# Patient Record
Sex: Female | Born: 2000 | Race: Black or African American | Hispanic: No | Marital: Single | Smoking: Never smoker
Health system: Southern US, Community
[De-identification: ages and names within clinical notes are randomized; demographics above are authoritative.]

## PROBLEM LIST (undated history)

## (undated) DIAGNOSIS — F419 Anxiety disorder, unspecified: Secondary | ICD-10-CM

## (undated) DIAGNOSIS — F32A Depression, unspecified: Secondary | ICD-10-CM

## (undated) HISTORY — PX: NO PAST SURGERIES: SHX2092

## (undated) HISTORY — DX: Depression, unspecified: F32.A

## (undated) HISTORY — DX: Anxiety disorder, unspecified: F41.9

---
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
## 2020-01-14 ENCOUNTER — Other Ambulatory Visit: Payer: Self-pay

## 2020-01-14 ENCOUNTER — Encounter: Payer: Self-pay | Admitting: Primary Care

## 2020-01-14 ENCOUNTER — Ambulatory Visit (INDEPENDENT_AMBULATORY_CARE_PROVIDER_SITE_OTHER): Payer: Managed Care, Other (non HMO) | Admitting: Primary Care

## 2020-01-14 DIAGNOSIS — F331 Major depressive disorder, recurrent, moderate: Secondary | ICD-10-CM | POA: Diagnosis not present

## 2020-01-14 DIAGNOSIS — F411 Generalized anxiety disorder: Secondary | ICD-10-CM

## 2020-01-14 MED ORDER — SERTRALINE HCL 25 MG PO TABS: 25.0000 mg | ORAL_TABLET | Freq: Every day | ORAL | 3 refills | Status: DC

## 2020-01-14 NOTE — Assessment & Plan Note (Signed)
Significant improvement on sertraline (Zoloft), she appears much better today. Denies SI/HI.  Refills provided. Continue sertraline 25 mg.

## 2020-01-14 NOTE — Progress Notes (Signed)
Subjective:    Patient ID: Brandy Bowen, female    DOB: 2001-04-22, 19 y.o.   MRN: [id number]  HPI  This visit occurred during the SARS-CoV-2 public health emergency.  Safety protocols were in place, including screening questions prior to the visit, additional usage of staff PPE, and extensive cleaning of exam room while observing appropriate contact time as indicated for disinfecting solutions.    Brandy Bowen is a 19 year old female with a history of depression, anxiety, frequent headaches who presents today for follow up .  She was last evaluated on 12/01/19 with symptoms of depression and anxiety. Symptoms included feeling anxious, tearfulness, worry. GAD 7 score of 16. She had been off of her Lexapro for quite some time as she didn't feel it helped with anxiety. Previously managed on fluoxetine which caused suicidal thoughts. During her last visit we initiated Zoloft 25 mg.  Since her last visit she's doing much better. She can drive in the car without worrying, feeling more motivated to do things, very little anxiety feelings, one anxiety attack since last visit. She denies headaches, SI/HI.   Review of Systems  Gastrointestinal: Negative for nausea.  Neurological: Negative for headaches.  Psychiatric/Behavioral: Negative for suicidal ideas.       See HPI       No past medical history on file.   Social History   Socioeconomic History  . Marital status: Single    Spouse name: Not on file  . Number of children: Not on file  . Years of education: Not on file  . Highest education level: Not on file  Occupational History  . Not on file  Tobacco Use  . Smoking status: Never Smoker  . Smokeless tobacco: Never Used  Substance and Sexual Activity  . Alcohol use: No    Alcohol/week: 0.0 standard drinks  . Drug use: No  . Sexual activity: Not on file  Other Topics Concern  . Not on file  Social History Narrative   Student at [employer].   Rising 10th grader.   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   Wants to be Social research officer, government.   She enjoys gymnastics, spending time with friend.    Social Determinants of Health   Financial Resource Strain:   . Difficulty of Paying Living Expenses: Not on file  Food Insecurity:   . Worried About Charity fundraiser in the Last Year: Not on file  . Ran Out of Food in the Last Year: Not on file  Transportation Needs:   . Lack of Transportation (Medical): Not on file  . Lack of Transportation (Non-Medical): Not on file  Physical Activity:   . Days of Exercise per Week: Not on file  . Minutes of Exercise per Session: Not on file  Stress:   . Feeling of Stress : Not on file  Social Connections:   . Frequency of Communication with Friends and Family: Not on file  . Frequency of Social Gatherings with Friends and Family: Not on file  . Attends Religious Services: Not on file  . Active Member of Clubs or Organizations: Not on file  . Attends Archivist Meetings: Not on file  . Marital Status: Not on file  Intimate Partner Violence:   . Fear of Current or Ex-Partner: Not on file  . Emotionally Abused: Not on file  . Physically Abused: Not on file  . Sexually Abused: Not on file    No past surgical history on file.  No  family history on file.  No Known Allergies  No current outpatient medications on file prior to visit.   No current facility-administered medications on file prior to visit.    BP 116/70   Pulse 95   Temp (!) 97.2 F (36.2 C) (Temporal)   Ht 5\' 3"  (1.6 m)   Wt 94 lb 12 oz (43 kg)   LMP 12/16/2019   SpO2 98%   BMI 16.78 kg/m    Objective:   Physical Exam  Constitutional: She appears well-nourished.  Cardiovascular: Normal rate and regular rhythm.  Respiratory: Effort normal and breath sounds normal.  Musculoskeletal:     Cervical back: Neck supple.  Skin: Skin is warm and dry.  Psychiatric: She has a normal mood and affect.           Assessment & Plan:

## 2020-01-14 NOTE — Assessment & Plan Note (Signed)
Significant improvement on sertraline (Zoloft), she appears much better today. Denies SI/HI.  Refills provided. Continue sertraline 25 mg. 

## 2020-01-14 NOTE — Patient Instructions (Signed)
Continue on sertraline 25 mg once daily for anxiety and depression.  Please update me if anything changes.   It was a pleasure to see you today!

## 2020-02-18 ENCOUNTER — Ambulatory Visit: Payer: Managed Care, Other (non HMO) | Attending: Internal Medicine

## 2020-02-18 DIAGNOSIS — Z23 Encounter for immunization: Secondary | ICD-10-CM

## 2020-02-18 NOTE — Progress Notes (Signed)
   Covid-19 Vaccination Clinic  Name:  Brandy Bowen    MRN: [id number] DOB: September 11, 2001  02/18/2020  Brandy Bowen was observed post Covid-19 immunization for 15 minutes without incident. She was provided with Vaccine Information Sheet and instruction to access the V-Safe system.   Brandy Bowen was instructed to call 911 with any severe reactions post vaccine: Marland Kitchen Difficulty breathing  . Swelling of face and throat  . A fast heartbeat  . A bad rash all over body  . Dizziness and weakness   Immunizations Administered    Name Date Dose VIS Date Route   Pfizer COVID-19 Vaccine 02/18/2020 11:26 AM 0.3 mL 10/23/2019 Intramuscular   Manufacturer: [employer], [employer]   Lot: [id number]   NDC: [address]-6

## 2020-03-15 ENCOUNTER — Ambulatory Visit: Payer: Managed Care, Other (non HMO) | Attending: Internal Medicine

## 2020-03-15 DIAGNOSIS — Z23 Encounter for immunization: Secondary | ICD-10-CM

## 2020-03-15 NOTE — Progress Notes (Signed)
   Covid-19 Vaccination Clinic  Name:  Brandy Bowen    MRN: [id number] DOB: 10/14/2001  03/15/2020  [name] was observed post Covid-19 immunization for 15 minutes without incident. She was provided with Vaccine Information Sheet and instruction to access the V-Safe system.   [name] was instructed to call 911 with any severe reactions post vaccine: Marland Kitchen Difficulty breathing  . Swelling of face and throat  . A fast heartbeat  . A bad rash all over body  . Dizziness and weakness   Immunizations Administered    Name Date Dose VIS Date Route   Pfizer COVID-19 Vaccine 03/15/2020 10:09 AM 0.3 mL 01/06/2019 Intramuscular   Manufacturer: [employer], [employer]   Lot: [id number]   NDC: [address]-2

## 2021-01-13 ENCOUNTER — Encounter: Payer: Self-pay | Admitting: Primary Care

## 2021-01-13 ENCOUNTER — Other Ambulatory Visit: Payer: Self-pay

## 2021-01-13 ENCOUNTER — Ambulatory Visit (INDEPENDENT_AMBULATORY_CARE_PROVIDER_SITE_OTHER): Payer: Managed Care, Other (non HMO) | Admitting: Primary Care

## 2021-01-13 ENCOUNTER — Other Ambulatory Visit: Payer: Self-pay | Admitting: Primary Care

## 2021-01-13 VITALS — BP 90/40 | HR 81 | Temp 98.2°F | Ht 63.0 in | Wt 90.0 lb

## 2021-01-13 DIAGNOSIS — F411 Generalized anxiety disorder: Secondary | ICD-10-CM | POA: Diagnosis not present

## 2021-01-13 DIAGNOSIS — E46 Unspecified protein-calorie malnutrition: Secondary | ICD-10-CM

## 2021-01-13 DIAGNOSIS — E559 Vitamin D deficiency, unspecified: Secondary | ICD-10-CM

## 2021-01-13 DIAGNOSIS — R110 Nausea: Secondary | ICD-10-CM | POA: Diagnosis not present

## 2021-01-13 DIAGNOSIS — F331 Major depressive disorder, recurrent, moderate: Secondary | ICD-10-CM

## 2021-01-13 LAB — COMPREHENSIVE METABOLIC PANEL
ALT: 10 U/L (ref 0–35)
AST: 16 U/L (ref 0–37)
Albumin: 4.4 g/dL (ref 3.5–5.2)
Alkaline Phosphatase: 36 U/L — ABNORMAL LOW (ref 47–119)
BUN: 18 mg/dL (ref 6–23)
CO2: 28 mEq/L (ref 19–32)
Calcium: 9.6 mg/dL (ref 8.4–10.5)
Chloride: 104 mEq/L (ref 96–112)
Creatinine, Ser: 0.8 mg/dL (ref 0.40–1.20)
GFR: 106.6 mL/min (ref >60.00)
Glucose, Bld: 86 mg/dL (ref 70–99)
Potassium: 4.3 mEq/L (ref 3.5–5.1)
Sodium: 139 mEq/L (ref 135–145)
Total Bilirubin: 0.7 mg/dL (ref 0.2–1.2)
Total Protein: 7.6 g/dL (ref 6.0–8.3)

## 2021-01-13 LAB — CBC WITH DIFFERENTIAL/PLATELET
Basophils Absolute: 0 10*3/uL (ref 0.0–0.1)
Basophils Relative: 0.5 % (ref 0.0–3.0)
Eosinophils Absolute: 0.1 10*3/uL (ref 0.0–0.7)
Eosinophils Relative: 1.6 % (ref 0.0–5.0)
HCT: 36.8 % (ref 36.0–49.0)
Hemoglobin: 12.3 g/dL (ref 12.0–16.0)
Lymphocytes Relative: 32.3 % (ref 24.0–48.0)
Lymphs Abs: 1.4 10*3/uL (ref 0.7–4.0)
MCHC: 33.4 g/dL (ref 31.0–37.0)
MCV: 89.1 fl (ref 78.0–98.0)
Monocytes Absolute: 0.4 10*3/uL (ref 0.1–1.0)
Monocytes Relative: 9 % (ref 3.0–12.0)
Neutro Abs: 2.4 10*3/uL (ref 1.4–7.7)
Neutrophils Relative %: 56.6 % (ref 43.0–71.0)
Platelets: 214 10*3/uL (ref 150.0–575.0)
RBC: 4.13 Mil/uL (ref 3.80–5.70)
RDW: 13.8 % (ref 11.4–15.5)
WBC: 4.2 10*3/uL — ABNORMAL LOW (ref 4.5–13.5)

## 2021-01-13 LAB — T4, FREE: Free T4: 0.77 ng/dL (ref 0.60–1.60)

## 2021-01-13 LAB — PHOSPHORUS: Phosphorus: 3.4 mg/dL — ABNORMAL LOW (ref 4.5–5.5)

## 2021-01-13 LAB — TSH: TSH: 0.75 u[IU]/mL (ref 0.40–5.00)

## 2021-01-13 LAB — MAGNESIUM: Magnesium: 1.9 mg/dL (ref 1.5–2.5)

## 2021-01-13 LAB — VITAMIN B12: Vitamin B-12: 381 pg/mL (ref 211–911)

## 2021-01-13 LAB — FOLATE: Folate: 4.8 ng/mL — ABNORMAL LOW (ref >5.9)

## 2021-01-13 LAB — VITAMIN D 25 HYDROXY (VIT D DEFICIENCY, FRACTURES): VITD: 11.93 ng/mL — ABNORMAL LOW (ref 30.00–100.00)

## 2021-01-13 MED ORDER — VITAMIN D (ERGOCALCIFEROL) 1.25 MG (50000 UNIT) PO CAPS: ORAL_CAPSULE | ORAL | 0 refills | Status: DC

## 2021-01-13 NOTE — Assessment & Plan Note (Signed)
Chronic for years, underweight for years.  Unclear if this is secondary to an eating disorder versus mechanical issue.  It does not seem as though Zoloft is contributing to nausea, but will need to continue to monitor.  Comments made today by patient arouse suspicion for eating disorder/mental cause for nausea and malnourishment.  She does agree to see a nutritionist, referral placed for Brandy Bowen who also treats eating disorders.  Referral also placed to GI for evaluation of any other cause.  Checking labs today.

## 2021-01-13 NOTE — Progress Notes (Signed)
Subjective:    Patient ID: Brandy Bowen, female    DOB: 05-13-2001, 20 y.o.   MRN: [id number]  HPI  This visit occurred during the SARS-CoV-2 public health emergency.  Safety protocols were in place, including screening questions prior to the visit, additional usage of staff PPE, and extensive cleaning of exam room while observing appropriate contact time as indicated for disinfecting solutions.   Brandy Bowen is a 20 year old female with a history of anxiety, depression, frequent headaches who presents today with a chief complaint of nausea and weight loss.  Today she endorses losing 12 pounds since December 2021 due to inability to eat. Chronic symptoms of not wanting to eat due to nausea and being afraid of vomiting so she's not eating, this has been present for years. She endorses "never having a good relationship with food", doesn't want to eat as it takes too much time. She endorses that she "wants to gain weight so bad". She would be interested in seeing a nutritionist as she'd like to learn how to eat "healthy calories"  She feels nauseated after each time she eats including a small amount of food, snacks, smoothies.   Diet currently consists of:  Breakfast: Toast with peanut butter when her mother forces her. Lunch: Skips, sometimes a few fries from fast food, small portions. Dinner: Skips, eats small portions Snacks: Granola bar, candy Beverages: Water, juice, soda, coffee, Boost  Exercise: She does not exercise.    She also reports feeling tired all the time, intermittent dizziness with postural changes, occasional esophageal reflux. She is having daily bowel movements. She denies night sweats, hemoptysis, vomiting. She continues to feel better mentally on Zoloft, doesn't believe her Zoloft is contributing to her symptoms as she's felt nauseated with food for years.  Weight of 95 pounds in 2017 103 pounds in 2018, 105 pounds in 2019, 94.5 pounds in 2021  Wt Readings from  Last 3 Encounters:  01/13/21 90 lb (40.8 kg) (<1 %, Z= -2.83)*  01/14/20 94 lb 12 oz (43 kg) (1 %, Z= -2.25)*  12/01/19 94 lb 8 oz (42.9 kg) (1 %, Z= -2.27)*   * Growth percentiles are based on CDC (Girls, 2-20 Years) data.   BP Readings from Last 3 Encounters:  01/13/21 (!) 90/40  01/14/20 116/70  12/01/19 100/68     Review of Systems  Constitutional: Positive for fatigue. Negative for fever.  Respiratory: Negative for shortness of breath.   Cardiovascular: Negative for chest pain and palpitations.  Gastrointestinal: Positive for nausea. Negative for abdominal pain, blood in stool, constipation and diarrhea.  Neurological: Positive for dizziness.  Psychiatric/Behavioral:       Improved on Zoloft       History reviewed. No pertinent past medical history.   Social History   Socioeconomic History  . Marital status: Single    Spouse name: Not on file  . Number of children: Not on file  . Years of education: Not on file  . Highest education level: Not on file  Occupational History  . Not on file  Tobacco Use  . Smoking status: Never Smoker  . Smokeless tobacco: Never Used  Substance and Sexual Activity  . Alcohol use: No    Alcohol/week: 0.0 standard drinks  . Drug use: No  . Sexual activity: Not on file  Other Topics Concern  . Not on file  Social History Narrative   Student at [employer].   Rising 10th grader.   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  Wants to be Forensic scientist.   She enjoys gymnastics, spending time with friend.    Social Determinants of Health   Financial Resource Strain: Not on file  Food Insecurity: Not on file  Transportation Needs: Not on file  Physical Activity: Not on file  Stress: Not on file  Social Connections: Not on file  Intimate Partner Violence: Not on file    History reviewed. No pertinent surgical history.  History reviewed. No pertinent family history.  No Known Allergies  Current Outpatient Medications on File  Prior to Visit  Medication Sig Dispense Refill  . sertraline (ZOLOFT) 25 MG tablet Take 1 tablet (25 mg total) by mouth daily. For anxiety. 90 tablet 3   No current facility-administered medications on file prior to visit.    BP (!) 90/40   Pulse 81   Temp 98.2 F (36.8 C) (Temporal)   Ht 5\' 3"  (1.6 m)   Wt 90 lb (40.8 kg)   LMP 01/03/2021 (Exact Date)   SpO2 100%   BMI 15.94 kg/m    Objective:   Physical Exam Constitutional:      Appearance: She is well-nourished. She is not ill-appearing.     Comments: Is not appear cachectic, but clavicular prominence noted bilaterally.  Cardiovascular:     Rate and Rhythm: Normal rate and regular rhythm.  Pulmonary:     Effort: Pulmonary effort is normal.     Breath sounds: Normal breath sounds.  Musculoskeletal:     Cervical back: Neck supple.  Skin:    General: Skin is warm and dry.  Neurological:     Mental Status: She is alert.  Psychiatric:        Mood and Affect: Mood and affect and mood normal.            Assessment & Plan:

## 2021-01-13 NOTE — Assessment & Plan Note (Signed)
Continues to feel improved with Zoloft 25 mg.  She does not believe that Zoloft is contributing to her nausea.  Continue Zoloft 25 mg for now.

## 2021-01-13 NOTE — Assessment & Plan Note (Signed)
Chronic for years, underweight for years.  Unclear if this is secondary to an eating disorder versus mechanical issue.  It does not seem as though Zoloft is contributing to nausea, but will need to continue to monitor.  Comments made today by patient arouse suspicion for eating disorder/mental cause for nausea and malnourishment.  She does agree to see a nutritionist, referral placed for Jeannie Sykes who also treats eating disorders.  Referral also placed to GI for evaluation of any other cause.  Checking labs today. 

## 2021-01-13 NOTE — Patient Instructions (Signed)
You will be contacted regarding your referral to GI and to the nutritionist.  Please let us know if you have not been contacted within two weeks.   Stop by the lab prior to leaving today. I will notify you of your results once received.   Please start a food journal of the foods that cause nausea.  Take this journal to the GI doctor and nutritionist.  Ensure you are consuming 64 ounces of water daily.  It was a pleasure to see you today!

## 2021-01-13 NOTE — Assessment & Plan Note (Signed)
Continues to feel improved with Zoloft 25 mg.  She does not believe that Zoloft is contributing to her nausea.  Continue Zoloft 25 mg for now. 

## 2021-01-17 ENCOUNTER — Encounter: Payer: Self-pay | Admitting: *Deleted

## 2021-01-18 ENCOUNTER — Other Ambulatory Visit: Payer: Self-pay | Admitting: Primary Care

## 2021-01-18 DIAGNOSIS — F411 Generalized anxiety disorder: Secondary | ICD-10-CM

## 2021-06-30 ENCOUNTER — Telehealth: Payer: Self-pay

## 2021-06-30 NOTE — Telephone Encounter (Signed)
Called patient to give information below and she states she has decided not to make appointment. Symptoms are improving and weight has increased. Will call our office if she changes her mind.

## 2021-07-01 NOTE — Telephone Encounter (Signed)
Noted  

## 2021-07-07 ENCOUNTER — Telehealth: Payer: Managed Care, Other (non HMO) | Admitting: Physician Assistant

## 2021-07-07 DIAGNOSIS — U071 COVID-19: Secondary | ICD-10-CM | POA: Diagnosis not present

## 2021-07-07 MED ORDER — BENZONATATE 100 MG PO CAPS: 100.0000 mg | ORAL_CAPSULE | Freq: Three times a day (TID) | ORAL | 0 refills | Status: DC | PRN

## 2021-07-07 MED ORDER — ONDANSETRON HCL 4 MG PO TABS: 4.0000 mg | ORAL_TABLET | Freq: Three times a day (TID) | ORAL | 0 refills | Status: DC | PRN

## 2021-07-07 MED ORDER — MOLNUPIRAVIR EUA 200MG CAPSULE: 4.0000 | ORAL_CAPSULE | Freq: Two times a day (BID) | ORAL | 0 refills | Status: AC

## 2021-07-07 NOTE — Patient Instructions (Signed)
Hello Brandy Bowen,  You are being placed in the home monitoring program for COVID-19 (commonly known as Coronavirus).  This is because you are suspected to have the virus or are known to have the virus.  If you are unsure which group you fall into call your clinic.    As part of this program, you'll answer a daily questionnaire in the MyChart mobile app. You'll receive a notification through the MyChart app when the questionnaire is available. When you log in to MyChart, you'll see the tasks in your To Do activity.       Clinicians will see any answers that are concerning and take appropriate steps.  If at any point you are having a medical emergency, call 911.  If otherwise concerned call your clinic instead of coming into the clinic or hospital.  To keep from spreading the disease you should: Stay home and limit contact with other people as much as possible.  Wash your hands frequently. Cover your coughs and sneezes with a tissue, and throw used tissues in the trash.   Clean and disinfect frequently touched surfaces and objects.    Take care of yourself by: Staying home Resting Drinking fluids Take fever-reducing medications (Tylenol/Acetaminophen and Ibuprofen)  For more information on the disease go to the Centers for Disease Control and Prevention website     You are being prescribed MOLNUPIRAVIR for COVID-19 infection.   Please call the pharmacy or go through the drive through vs going inside if you are picking up the mediation yourself to prevent further spread. If prescribed to a [facility] affiliated pharmacy, a pharmacist will bring the medication out to your car.   ADMINISTRATION INSTRUCTIONS: Take with or without food. Swallow the tablets whole. Don't chew, crush, or break the medications because it might not work as well  For each dose of the medication, you should be taking FOUR tablets at one time, TWICE a day   Finish your full five-day course of Molnupiravir even if  you feel better before you're done. Stopping this medication too early can make it less effective to prevent severe illness related to COVID19.    Molnupiravir is prescribed for YOU ONLY. Don't share it with others, even if they have similar symptoms as you. This medication might not be right for everyone.   Make sure to take steps to protect yourself and others while you're taking this medication in order to get well soon and to prevent others from getting sick with COVID-19.   **If you are of childbearing potential (any gender) - it is advised to not get pregnant while taking this medication and recommended that condoms are used for female partners the next 3 months after taking the medication out of extreme caution    COMMON SIDE EFFECTS: Diarrhea Nausea  Dizziness    If your COVID-19 symptoms get worse, get medical help right away. Call 911 if you experience symptoms such as worsening cough, trouble breathing, chest pain that doesn't go away, confusion, a hard time staying awake, and pale or blue-colored skin. This medication won't prevent all COVID-19 cases from getting worse.    Can take to lessen severity: Vit C 500mg  twice daily Quercertin 250-500mg  twice daily Zinc 75-100mg  daily Melatonin 3-6 mg at bedtime Vit D3 1000-2000 IU daily Aspirin 81 mg daily with food Optional: Famotidine 20mg  daily Also can add tylenol/ibuprofen as needed for fevers and body aches May add Mucinex or Mucinex DM as needed for cough/congestion  10 Things You Can  Do to Manage Your COVID-19 Symptoms at Home If you have possible or confirmed COVID-19 Stay home except to get medical care. Monitor your symptoms carefully. If your symptoms get worse, call your healthcare provider immediately. Get rest and stay hydrated. If you have a medical appointment, call the healthcare provider ahead of time and tell them that you have or may have COVID-19. For medical emergencies, call 911 and notify the dispatch  personnel that you have or may have COVID-19. Cover your cough and sneezes with a tissue or use the inside of your elbow. Wash your hands often with soap and water for at least 20 seconds or clean your hands with an alcohol-based hand sanitizer that contains at least 60% alcohol. As much as possible, stay in a specific room and away from other people in your home. Also, you should use a separate bathroom, if available. If you need to be around other people in or outside of the home, wear a mask. Avoid sharing personal items with other people in your household, like dishes, towels, and bedding. Clean all surfaces that are touched often, like counters, tabletops, and doorknobs. Use household cleaning sprays or wipes according to the label instructions. [email or web address] 05/27/2020 This information is not intended to replace advice given to you by your health care provider. Make sure you discuss any questions you have with your health care provider. Document Revised: 03/16/2021 Document Reviewed: 03/16/2021 Elsevier Patient Education  [place].

## 2021-07-07 NOTE — Progress Notes (Signed)
Virtual Visit Consent   [name], you are scheduled for a virtual visit with a  provider today.     Just as with appointments in the office, your consent must be obtained to participate.  Your consent will be active for this visit and any virtual visit you may have with one of our providers in the next 365 days.     If you have a MyChart account, a copy of this consent can be sent to you electronically.  All virtual visits are billed to your insurance company just like a traditional visit in the office.    As this is a virtual visit, video technology does not allow for your provider to perform a traditional examination.  This may limit your provider's ability to fully assess your condition.  If your provider identifies any concerns that need to be evaluated in person or the need to arrange testing (such as labs, EKG, etc.), we will make arrangements to do so.     Although advances in technology are sophisticated, we cannot ensure that it will always work on either your end or our end.  If the connection with a video visit is poor, the visit may have to be switched to a telephone visit.  With either a video or telephone visit, we are not always able to ensure that we have a secure connection.     I need to obtain your verbal consent now.   Are you willing to proceed with your visit today?    Brandy Bowen has provided verbal consent on 07/07/2021 for a virtual visit (video or telephone).   Margaretann Loveless, PA-C   Date: 07/07/2021 1:05 PM   Virtual Visit via Video Note   I, Margaretann Loveless, connected with  Brandy Bowen  ([id number], 09-22-2001) on 07/07/21 at  1:00 PM EDT by a video-enabled telemedicine application and verified that I am speaking with the correct person using two identifiers.  Location: Patient: Virtual Visit Location Patient: Home Provider: Virtual Visit Location Provider: Home Office   I discussed the limitations of evaluation and management by  telemedicine and the availability of in person appointments. The patient expressed understanding and agreed to proceed.    History of Present Illness: Brandy Bowen is a 20 y.o. who identifies as a female who was assigned female at birth, and is being seen today for Covid 23.  HPI: URI  This is a new problem. Episode onset: tested positive for Covid 19, last night; symptoms started yesterday. The problem has been gradually worsening. Maximum temperature: 99.7 highest. The fever has been present for Less than 1 day. Associated symptoms include congestion, coughing, headaches, rhinorrhea and a sore throat (1st symptom). Pertinent negatives include no diarrhea, ear pain, nausea, plugged ear sensation, sinus pain or vomiting. Associated symptoms comments: Muscle cramping, body aches, fatigue. Treatments tried: thera flu. The treatment provided mild relief.    Problems:  Patient Active Problem List   Diagnosis Date Noted   Malnutrition (HCC) 01/13/2021   Chronic nausea 01/13/2021   GAD (generalized anxiety disorder) 12/01/2019   Major depressive disorder 04/10/2016   Frequent headaches 04/10/2016    Allergies: No Known Allergies Medications:  Current Outpatient Medications:    sertraline (ZOLOFT) 25 MG tablet, TAKE 1 TABLET (25 MG TOTAL) BY MOUTH DAILY. FOR ANXIETY., Disp: 90 tablet, Rfl: 2   Vitamin D, Ergocalciferol, (DRISDOL) 1.25 MG (50000 UNIT) CAPS capsule, Take 1 capsule by mouth once weekly for 12 weeks., Disp: 12 capsule, Rfl:  0  Observations/Objective: Patient is well-developed, well-nourished in no acute distress.  Resting comfortably at home.  Head is normocephalic, atraumatic.  No labored breathing.  Speech is clear and coherent with logical content.  Patient is alert and oriented at baseline.    Assessment and Plan: There are no diagnoses linked to this encounter. - Continue OTC symptomatic management of choice - Will send OTC vitamins and supplement information through  AVS - Molnupiravir prescribed; discussed conservative management and patient low risk; wished to proceed with antiviral treatment over conservative treatment - Tessalon perles for cough, zofran for nausea - Patient enrolled in MyChart symptom monitoring - Push fluids - Rest as needed - Discussed return precautions and when to seek in-person evaluation, sent via AVS as well'  Follow Up Instructions: I discussed the assessment and treatment plan with the patient. The patient was provided an opportunity to ask questions and all were answered. The patient agreed with the plan and demonstrated an understanding of the instructions.  A copy of instructions were sent to the patient via MyChart.  The patient was advised to call back or seek an in-person evaluation if the symptoms worsen or if the condition fails to improve as anticipated.  Time:  I spent 19 minutes with the patient via telehealth technology discussing the above problems/concerns.    Margaretann Loveless, PA-C

## 2021-11-16 ENCOUNTER — Other Ambulatory Visit: Payer: Self-pay

## 2021-11-16 ENCOUNTER — Telehealth (INDEPENDENT_AMBULATORY_CARE_PROVIDER_SITE_OTHER): Payer: Self-pay | Admitting: Primary Care

## 2021-11-16 VITALS — Ht 63.0 in | Wt 89.6 lb

## 2021-11-16 DIAGNOSIS — E46 Unspecified protein-calorie malnutrition: Secondary | ICD-10-CM

## 2021-11-16 DIAGNOSIS — F3342 Major depressive disorder, recurrent, in full remission: Secondary | ICD-10-CM

## 2021-11-16 DIAGNOSIS — F411 Generalized anxiety disorder: Secondary | ICD-10-CM

## 2021-11-16 MED ORDER — SERTRALINE HCL 25 MG PO TABS: 25.0000 mg | ORAL_TABLET | Freq: Every day | ORAL | 3 refills | Status: DC

## 2021-11-16 NOTE — Assessment & Plan Note (Signed)
Doing well on Zoloft 25 mg, continue same. Refills sent to pharmacy. 

## 2021-11-16 NOTE — Patient Instructions (Signed)
Continue Zoloft 25 mg for anxiety and depression.  It was a pleasure to see you today!

## 2021-11-16 NOTE — Progress Notes (Signed)
Patient ID: Brandy Bowen, female    DOB: [birth year], 21 y.o.   MRN: [id number]  Virtual visit completed through [place], a video enabled telemedicine application. Due to national recommendations of social distancing due to COVID-19, a virtual visit is felt to be most appropriate for this patient at this time. Reviewed limitations, risks, security and privacy concerns of performing a virtual visit and the availability of in person appointments. I also reviewed that there may be a patient responsible charge related to this service. The patient agreed to proceed.   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, office Persons participating in this virtual visit: patient, provider   If any vitals were documented, they were collected by patient at home unless specified below.    Ht 5\' 3"  (1.6 m)    Wt 89 lb 9.6 oz (40.6 kg)    BMI 15.87 kg/m    CC: Anxiety  Subjective:   HPI: Brandy Bowen is a 21 y.o. female with a history of anxiety presenting on 11/16/2021 for follow up of anxiety and requesting medication changes.  Last evaluated on 01/13/21 for nausea and weight loss and symptoms of anxiety. At the time she was managed on Zoloft, endorsed feeling "better mentally".   She sent a message via MyChart on 11/01/21 requesting to start Lexapro for which she was managed on in 2019, she thinks she felt better on lexapro.   Today she endorses doing well on Zoloft 25 mg, felt better on Zoloft rather than Lexapro. She did stop Zoloft mid 2022 as she forgot to take routinely and also felt better.   She began to notice symptoms of feeling anxious, feeling overwhelmed in November 2022 when starting a new job so she resumed Zoloft in mid December 2022.  Since resuming Zoloft she's feeling much better and would like refills. She denies SI/HI. She is eating well, has put on 8 pounds over the last several months.      Relevant past medical, surgical, family and social history  reviewed and updated as indicated. Interim medical history since our last visit reviewed. Allergies and medications reviewed and updated. Outpatient Medications Prior to Visit  Medication Sig Dispense Refill   benzonatate (TESSALON) 100 MG capsule Take 1 capsule (100 mg total) by mouth 3 (three) times daily as needed. 30 capsule 0   ondansetron (ZOFRAN) 4 MG tablet Take 1 tablet (4 mg total) by mouth every 8 (eight) hours as needed for nausea or vomiting. 20 tablet 0   sertraline (ZOLOFT) 25 MG tablet TAKE 1 TABLET (25 MG TOTAL) BY MOUTH DAILY. FOR ANXIETY. 90 tablet 2   Vitamin D, Ergocalciferol, (DRISDOL) 1.25 MG (50000 UNIT) CAPS capsule Take 1 capsule by mouth once weekly for 12 weeks. 12 capsule 0   No facility-administered medications prior to visit.     Per HPI unless specifically indicated in ROS section below Review of Systems Objective:  Ht 5\' 3"  (1.6 m)    Wt 89 lb 9.6 oz (40.6 kg)    BMI 15.87 kg/m   Wt Readings from Last 3 Encounters:  11/16/21 89 lb 9.6 oz (40.6 kg)  01/13/21 90 lb (40.8 kg) (<1 %, Z= -2.83)*  01/14/20 94 lb 12 oz (43 kg) (1 %, Z= -2.25)*   * Growth percentiles are based on CDC (Girls, 2-20 Years) data.       Physical exam: General: Alert and oriented x 3, no distress, does not appear sickly  Pulmonary: Speaks in complete sentences without  increased work of breathing, no cough during visit.  Psychiatric: Normal mood, thought content, and behavior.     Results for orders placed or performed in visit on 01/13/21  TSH  Result Value Ref Range   TSH 0.75 0.40 - 5.00 uIU/mL  Comprehensive metabolic panel  Result Value Ref Range   Sodium 139 135 - 145 mEq/L   Potassium 4.3 3.5 - 5.1 mEq/L   Chloride 104 96 - 112 mEq/L   CO2 28 19 - 32 mEq/L   Glucose, Bld 86 70 - 99 mg/dL   BUN 18 6 - 23 mg/dL   Creatinine, Ser 0.80 0.40 - 1.20 mg/dL   Total Bilirubin 0.7 0.2 - 1.2 mg/dL   Alkaline Phosphatase 36 (L) 47 - 119 U/L   AST 16 0 - 37 U/L   ALT 10 0  - 35 U/L   Total Protein 7.6 6.0 - 8.3 g/dL   Albumin 4.4 3.5 - 5.2 g/dL   GFR 106.60 >60.00 mL/min   Calcium 9.6 8.4 - 10.5 mg/dL  VITAMIN D 25 Hydroxy (Vit-D Deficiency, Fractures)  Result Value Ref Range   VITD 11.93 (L) 30.00 - 100.00 ng/mL  T4, free  Result Value Ref Range   Free T4 0.77 0.60 - 1.60 ng/dL  Vitamin B12  Result Value Ref Range   Vitamin B-12 381 211 - 911 pg/mL  Folate  Result Value Ref Range   Folate 4.8 (L) >5.9 ng/mL  Phosphorus  Result Value Ref Range   Phosphorus 3.4 (L) 4.5 - 5.5 mg/dL  Magnesium  Result Value Ref Range   Magnesium 1.9 1.5 - 2.5 mg/dL  CBC with Differential/Platelet  Result Value Ref Range   WBC 4.2 (L) 4.5 - 13.5 K/uL   RBC 4.13 3.80 - 5.70 Mil/uL   Hemoglobin 12.3 12.0 - 16.0 g/dL   HCT 36.8 36.0 - 49.0 %   MCV 89.1 78.0 - 98.0 fl   MCHC 33.4 31.0 - 37.0 g/dL   RDW 13.8 11.4 - 15.5 %   Platelets 214.0 150.0 - 575.0 K/uL   Neutrophils Relative % 56.6 43.0 - 71.0 %   Lymphocytes Relative 32.3 24.0 - 48.0 %   Monocytes Relative 9.0 3.0 - 12.0 %   Eosinophils Relative 1.6 0.0 - 5.0 %   Basophils Relative 0.5 0.0 - 3.0 %   Neutro Abs 2.4 1.4 - 7.7 K/uL   Lymphs Abs 1.4 0.7 - 4.0 K/uL   Monocytes Absolute 0.4 0.1 - 1.0 K/uL   Eosinophils Absolute 0.1 0.0 - 0.7 K/uL   Basophils Absolute 0.0 0.0 - 0.1 K/uL   Assessment & Plan:   Problem List Items Addressed This Visit       Other   Major depressive disorder    Doing well on Zoloft 25 mg, continue same. Refills sent to pharmacy.      Relevant Medications   sertraline (ZOLOFT) 25 MG tablet   GAD (generalized anxiety disorder)    Doing well on Zoloft 25 mg, continue same. Refills sent to pharmacy.      Relevant Medications   sertraline (ZOLOFT) 25 MG tablet   Malnutrition (HCC)    Endorses 8 pound weight gain over last few months. She declines any evaluation at this time but will update if anything changes.        Meds ordered this encounter  Medications    sertraline (ZOLOFT) 25 MG tablet    Sig: Take 1 tablet (25 mg total) by mouth daily. For anxiety.  Dispense:  90 tablet    Refill:  3    Order Specific Question:   Supervising Provider    Answer:   BEDSOLE, AMY E [[id number]]   No orders of the defined types were placed in this encounter.   I discussed the assessment and treatment plan with the patient. The patient was provided an opportunity to ask questions and all were answered. The patient agreed with the plan and demonstrated an understanding of the instructions. The patient was advised to call back or seek an in-person evaluation if the symptoms worsen or if the condition fails to improve as anticipated.  Follow up plan:  Continue Zoloft 25 mg for anxiety and depression.  It was a pleasure to see you today!   Pleas Koch, NP

## 2021-11-16 NOTE — Assessment & Plan Note (Signed)
Endorses 8 pound weight gain over last few months. She declines any evaluation at this time but will update if anything changes.

## 2022-04-07 ENCOUNTER — Ambulatory Visit
Admission: EM | Admit: 2022-04-07 | Discharge: 2022-04-07 | Disposition: A | Discharge status: Home / Self Care | Payer: BC Managed Care – PPO | Attending: Family Medicine | Admitting: Family Medicine

## 2022-04-07 ENCOUNTER — Ambulatory Visit (INDEPENDENT_AMBULATORY_CARE_PROVIDER_SITE_OTHER): Payer: BC Managed Care – PPO

## 2022-04-07 ENCOUNTER — Ambulatory Visit: Payer: BC Managed Care – PPO

## 2022-04-07 ENCOUNTER — Encounter: Payer: Self-pay | Admitting: Emergency Medicine

## 2022-04-07 DIAGNOSIS — R0781 Pleurodynia: Secondary | ICD-10-CM

## 2022-04-07 DIAGNOSIS — S199XXA Unspecified injury of neck, initial encounter: Secondary | ICD-10-CM

## 2022-04-07 LAB — POCT URINE PREGNANCY: Preg Test, Ur: NEGATIVE

## 2022-04-07 MED ORDER — TIZANIDINE HCL 4 MG PO TABS
4.0000 mg | ORAL_TABLET | Freq: Every day | ORAL | 0 refills | Status: DC
Start: 2022-04-07 — End: 2022-06-01

## 2022-04-07 MED ORDER — NAPROXEN 375 MG PO TABS: 375.0000 mg | ORAL_TABLET | Freq: Two times a day (BID) | ORAL | 0 refills | Status: AC | PRN
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
## 2022-04-07 NOTE — Discharge Instructions (Signed)
I will send you a Mychart message once I receive your x-ray results.  Take medication as prescribed.
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
## 2022-06-01 ENCOUNTER — Encounter: Payer: Self-pay | Admitting: Primary Care

## 2022-06-01 ENCOUNTER — Ambulatory Visit (INDEPENDENT_AMBULATORY_CARE_PROVIDER_SITE_OTHER): Payer: BC Managed Care – PPO | Admitting: Primary Care

## 2022-06-01 VITALS — BP 100/58 | HR 98 | Temp 98.3°F | Ht 63.5 in | Wt 87.0 lb

## 2022-06-01 DIAGNOSIS — Z0000 Encounter for general adult medical examination without abnormal findings: Secondary | ICD-10-CM

## 2022-06-01 DIAGNOSIS — Z114 Encounter for screening for human immunodeficiency virus [HIV]: Secondary | ICD-10-CM | POA: Diagnosis not present

## 2022-06-01 DIAGNOSIS — F3342 Major depressive disorder, recurrent, in full remission: Secondary | ICD-10-CM

## 2022-06-01 DIAGNOSIS — F411 Generalized anxiety disorder: Secondary | ICD-10-CM

## 2022-06-01 DIAGNOSIS — Z124 Encounter for screening for malignant neoplasm of cervix: Secondary | ICD-10-CM

## 2022-06-01 DIAGNOSIS — Z23 Encounter for immunization: Secondary | ICD-10-CM | POA: Diagnosis not present

## 2022-06-01 DIAGNOSIS — E46 Unspecified protein-calorie malnutrition: Secondary | ICD-10-CM

## 2022-06-01 DIAGNOSIS — Z1159 Encounter for screening for other viral diseases: Secondary | ICD-10-CM | POA: Diagnosis not present

## 2022-06-01 DIAGNOSIS — R519 Headache, unspecified: Secondary | ICD-10-CM

## 2022-06-01 LAB — CBC WITH DIFFERENTIAL/PLATELET
Basophils Absolute: 0 10*3/uL (ref 0.0–0.1)
Basophils Relative: 0.6 % (ref 0.0–3.0)
Eosinophils Absolute: 0 10*3/uL (ref 0.0–0.7)
Eosinophils Relative: 1 % (ref 0.0–5.0)
HCT: 38.5 % (ref 36.0–46.0)
Hemoglobin: 12.7 g/dL (ref 12.0–15.0)
Lymphocytes Relative: 39.7 % (ref 12.0–46.0)
Lymphs Abs: 1.4 10*3/uL (ref 0.7–4.0)
MCHC: 33 g/dL (ref 30.0–36.0)
MCV: 91.5 fl (ref 78.0–100.0)
Monocytes Absolute: 0.3 10*3/uL (ref 0.1–1.0)
Monocytes Relative: 9.2 % (ref 3.0–12.0)
Neutro Abs: 1.8 10*3/uL (ref 1.4–7.7)
Neutrophils Relative %: 49.5 % (ref 43.0–77.0)
Platelets: 242 10*3/uL (ref 150.0–400.0)
RBC: 4.2 Mil/uL (ref 3.87–5.11)
RDW: 13.1 % (ref 11.5–15.5)
WBC: 3.6 10*3/uL — ABNORMAL LOW (ref 4.0–10.5)

## 2022-06-01 LAB — COMPREHENSIVE METABOLIC PANEL
ALT: 10 U/L (ref 0–35)
AST: 16 U/L (ref 0–37)
Albumin: 4.6 g/dL (ref 3.5–5.2)
Alkaline Phosphatase: 40 U/L (ref 39–117)
BUN: 11 mg/dL (ref 6–23)
CO2: 29 mEq/L (ref 19–32)
Calcium: 9.5 mg/dL (ref 8.4–10.5)
Chloride: 101 mEq/L (ref 96–112)
Creatinine, Ser: 0.78 mg/dL (ref 0.40–1.20)
GFR: 108.82 mL/min (ref >60.00)
Glucose, Bld: 86 mg/dL (ref 70–99)
Potassium: 3.6 mEq/L (ref 3.5–5.1)
Sodium: 137 mEq/L (ref 135–145)
Total Bilirubin: 0.7 mg/dL (ref 0.2–1.2)
Total Protein: 7.8 g/dL (ref 6.0–8.3)

## 2022-06-01 LAB — PHOSPHORUS: Phosphorus: 2.7 mg/dL (ref 2.3–4.6)

## 2022-06-01 LAB — VITAMIN D 25 HYDROXY (VIT D DEFICIENCY, FRACTURES): VITD: 19.14 ng/mL — ABNORMAL LOW (ref 30.00–100.00)

## 2022-06-01 LAB — FOLATE: Folate: 5.3 ng/mL — ABNORMAL LOW (ref >5.9)

## 2022-06-01 NOTE — Assessment & Plan Note (Signed)
Denies concerns today. °Continue to monitor. °

## 2022-06-01 NOTE — Assessment & Plan Note (Signed)
Controlled.  Continue Zoloft 25 mg daily. 

## 2022-06-01 NOTE — Assessment & Plan Note (Signed)
Controlled.  Continue Zoloft 25 mg daily.

## 2022-06-01 NOTE — Assessment & Plan Note (Signed)
Tetanus due, provided today. HPV vaccines due, we discussed and she will think about this. Pap smear due, she prefers to see GYN, referral placed.  Long discussion regarding the need to work on improving her diet.   Exam today stable. Labs pending.

## 2022-06-01 NOTE — Patient Instructions (Signed)
Stop by the lab prior to leaving today. I will notify you of your results once received.   You will be contacted regarding your referral to GYN.  Please let us know if you have not been contacted within two weeks.   Please consider the HPV vaccines as discussed.   It was a pleasure to see you today!  HPV and Cancer Information HPV (human papillomavirus)is a very common virus that spreads easily from person to person through skin-to-skin or sexual contact. There are many types of HPV. It often does not cause symptoms. However, depending upon the type, it may sometimes cause warts in the genitals (genital or mucosal HPV), or on the hands or feet (cutaneous or nonmucosal HPV). It is possible to be infected for a long time and pass HPV to others without knowing it. Some HPV infections go away on their own within 2 years, but other HPV infections are considered high-risk and may cause changes in cells that could lead to cancer. You can take steps to avoid HPV infection and to lower your risk of getting cancer. How can HPV affect me? HPV can cause warts in the genitals or on the hands or feet. It can also cause wart-like lesions in the throat.  Certain types of genital HPV can also cause cancer, which may include: Cervical cancer. Vaginal cancer. Vulvar cancer. Anal cancer. Throat cancer. Tongue or mouth cancer. Penile cancer. How does HPV spread? HPV spreads easily through direct person to person contact. Genital HPV spreads through sexual contact. You can get HPV from vaginal sex, oral sex, anal sex, or just by touching someone's genitals. Even people who have only one sexual partner may have HPV because that partner may have it. HPV often does not cause symptoms, so most infected people do not know that they have it. What actions can I take to prevent HPV? Take the following steps to help prevent HPV infection: Talk with your health care provider about getting the HPV vaccine. This vaccine  protects against the types of HPV that could cause cancer. Limit the number of people you have sex with. Also, avoid having sex with people who have had many sexual partners. Use a condom during sex. Talk with your sexual partners about their health. What actions can I take to lower my risk for cancer? Having a healthy lifestyle and taking some preventive steps can help lower your cancer risk, whether or not you have genital HPV. Some steps you can take include: Lifestyle Practice safe sex to help prevent HPV infection. Do not use any products that contain nicotine or tobacco, such as cigarettes, e-cigarettes, and chewing tobacco. If you need help quitting, ask your health care provider. Eat foods that have antioxidants, such as fruits, vegetables, and grains. Try to eat at least 5 servings of fruits and vegetables every day. Get regular exercise. Lose weight if you are overweight. Practice good oral hygiene. This includes flossing and brushing your teeth every day. Other preventive steps Get the HPV vaccine as told by your health care provider. Get tested for STIs even if you do not have symptoms of HPV. You may have HPV and not know it. If you are a woman, get regular Pap and HPV tests. Talk with your health care provider about how often you need these tests. Pap tests will help identify changes in cells that can lead to cancer. HPV tests will help identify the presence of HPV in cells in the cervix. Where to find more information  Learn more about HPV and cancer from: Centers for Disease Control and Prevention: [employer] National Cancer Institute: www.cancer.gov American Cancer Society: www.cancer.org Contact a health care provider if: You have genital warts. You are sexually active and think you may have HPV. You did not protect yourself during sex and would like to be tested for STIs. Summary Human papillomavirus (HPV) is a very common virus that spreads easily from person to  person and ishighly contagious. Certain types of genital HPV are considered to be high risk and may cause changes in cells that could lead to cancer. You should take steps to avoid HPV infection, such as limiting the number of people you have sex with, using condoms during sex, and getting the HPV vaccine. Lifestyle changes can help lower your risk of cancer. These include eating a healthy diet, getting regular exercise, and not using any products that contain nicotine or tobacco. You may have HPV and not know it. Get tested for STIs even if you do not have symptoms of HPV. If you are a woman, have regular Pap tests and HPV tests as directed by your health care provider. This information is not intended to replace advice given to you by your health care provider. Make sure you discuss any questions you have with your health care provider. Document Revised: 06/14/2020 Document Reviewed: 06/14/2020 Elsevier Patient Education  2023 [employer].

## 2022-06-01 NOTE — Progress Notes (Signed)
Subjective:    Patient ID: Brandy Bowen, female    DOB: 12-29-2000, 21 y.o.   MRN: [id number]  HPI  Brandy Bowen is a very pleasant 21 y.o. female who presents today for complete physical and follow up of chronic conditions.  Immunizations: -Tetanus: 2013, due today -Influenza: Did not complete last season -Covid-19: 3 vaccines  -HPV: Never completed   Diet: Fair diet. Waxes and wanes with appetite.  Exercise: No regular exercise.  Eye exam: Completes annually  Dental exam: Completes semi-annually   Pap Smear: Never completed. Due. Declines today, would like to be referred to GYN.  BP Readings from Last 3 Encounters:  06/01/22 (!) 100/58  04/07/22 108/83  01/13/21 (!) 90/40   Wt Readings from Last 3 Encounters:  06/01/22 87 lb (39.5 kg)  11/16/21 89 lb 9.6 oz (40.6 kg)  01/13/21 90 lb (40.8 kg) (<1 %, Z= -2.83)*   * Growth percentiles are based on CDC (Girls, 2-20 Years) data.         Review of Systems  Constitutional:  Negative for unexpected weight change.  HENT:  Negative for rhinorrhea.   Respiratory:  Negative for cough and shortness of breath.   Cardiovascular:  Negative for chest pain.  Gastrointestinal:  Negative for constipation and diarrhea.  Genitourinary:  Negative for difficulty urinating.  Musculoskeletal:  Negative for arthralgias and myalgias.  Skin:  Negative for rash.  Allergic/Immunologic: Negative for environmental allergies.  Neurological:  Negative for dizziness, numbness and headaches.  Psychiatric/Behavioral:  The patient is not nervous/anxious.          No past medical history on file.  Social History   Socioeconomic History   Marital status: Single    Spouse name: Not on file   Number of children: Not on file   Years of education: Not on file   Highest education level: Not on file  Occupational History   Not on file  Tobacco Use   Smoking status: Never   Smokeless tobacco: Never  Vaping Use   Vaping Use: Every  day  Substance and Sexual Activity   Alcohol use: No    Alcohol/week: 0.0 standard drinks of alcohol   Drug use: Yes    Types: Marijuana   Sexual activity: Not on file  Other Topics Concern   Not on file  Social History Narrative   Student at [employer].   Rising 10th grader.   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   Wants to be Forensic scientist.   She enjoys gymnastics, spending time with friend.    Social Determinants of Health   Financial Resource Strain: Not on file  Food Insecurity: Not on file  Transportation Needs: Not on file  Physical Activity: Not on file  Stress: Not on file  Social Connections: Not on file  Intimate Partner Violence: Not on file    No past surgical history on file.  Family History  Problem Relation Age of Onset   Diabetes Maternal Grandmother    Hyperlipidemia Maternal Grandmother    Hyperlipidemia Maternal Grandfather     No Known Allergies  Current Outpatient Medications on File Prior to Visit  Medication Sig Dispense Refill   sertraline (ZOLOFT) 25 MG tablet Take 1 tablet (25 mg total) by mouth daily. For anxiety. 90 tablet 3   No current facility-administered medications on file prior to visit.    BP (!) 100/58   Pulse 98   Temp 98.3 F (36.8 C) (Oral)   Ht 5' 3.5" (1.613  m)   Wt 87 lb (39.5 kg)   LMP 05/19/2022   SpO2 100%   BMI 15.17 kg/m  Objective:   Physical Exam HENT:     Right Ear: Tympanic membrane and ear canal normal.     Left Ear: Tympanic membrane and ear canal normal.     Nose: Nose normal.  Eyes:     Conjunctiva/sclera: Conjunctivae normal.     Pupils: Pupils are equal, round, and reactive to light.  Neck:     Thyroid: No thyromegaly.  Cardiovascular:     Rate and Rhythm: Normal rate and regular rhythm.     Heart sounds: No murmur heard. Pulmonary:     Effort: Pulmonary effort is normal.     Breath sounds: Normal breath sounds. No rales.  Abdominal:     General: Bowel sounds are normal.      Palpations: Abdomen is soft.     Tenderness: There is no abdominal tenderness.  Musculoskeletal:        General: Normal range of motion.     Cervical back: Neck supple.  Lymphadenopathy:     Cervical: No cervical adenopathy.  Skin:    General: Skin is warm and dry.     Findings: No rash.  Neurological:     Mental Status: She is alert and oriented to person, place, and time.     Cranial Nerves: No cranial nerve deficit.     Deep Tendon Reflexes: Reflexes are normal and symmetric.  Psychiatric:        Mood and Affect: Mood normal.           Assessment & Plan:   Problem List Items Addressed This Visit       Other   Major depressive disorder    Controlled.  Continue Zoloft 25 mg daily      Frequent headaches    Denies concerns today. Continue to monitor.       GAD (generalized anxiety disorder)    Controlled.  Continue Zoloft 25 mg daily.      Malnutrition (HCC)    Weight loss of two pounds since last visit.  We had a frank discussion about her low weight, she denies restricting and purging food. She never followed with the nutritionist as recommended previously.   I offered her a nutritionist referral for which she kindly declines.  Will closely monitor.   Mother is aware of my concerns.  Labs pending.      Relevant Orders   CBC with Differential/Platelet   Folate   VITAMIN D 25 Hydroxy (Vit-D Deficiency, Fractures)   Phosphorus   Comprehensive metabolic panel   Preventative health care - Primary    Tetanus due, provided today. HPV vaccines due, we discussed and she will think about this. Pap smear due, she prefers to see GYN, referral placed.  Long discussion regarding the need to work on improving her diet.   Exam today stable. Labs pending.      Other Visit Diagnoses     Screening for HIV (human immunodeficiency virus)       Relevant Orders   HIV antibody (with reflex)   Encounter for hepatitis C screening test for low risk patient        Relevant Orders   Hepatitis C Antibody   Screening for cervical cancer       Relevant Orders   Ambulatory referral to Obstetrics / Gynecology          Doreene Nest, NP

## 2022-06-01 NOTE — Assessment & Plan Note (Signed)
Weight loss of two pounds since last visit.  We had a frank discussion about her low weight, she denies restricting and purging food. She never followed with the nutritionist as recommended previously.   I offered her a nutritionist referral for which she kindly declines.  Will closely monitor.   Mother is aware of my concerns.  Labs pending.

## 2022-06-01 NOTE — Addendum Note (Signed)
Addended by: Donnamarie Poag on: 06/01/2022 10:45 AM   Modules accepted: Orders

## 2022-06-04 LAB — HEPATITIS C ANTIBODY: Hepatitis C Ab: NONREACTIVE

## 2022-06-04 LAB — HIV ANTIBODY (ROUTINE TESTING W REFLEX): HIV 1&2 Ab, 4th Generation: NONREACTIVE

## 2022-08-01 NOTE — Progress Notes (Unsigned)
   PCP:  Pleas Koch, NP   No chief complaint on file.    HPI:      Brandy Bowen is a 21 y.o. No obstetric history on file. whose LMP was No LMP recorded., presents today for her NP annual examination.  Her menses are {norm/abn:715}, lasting {number: 22536} days.  Dysmenorrhea {dysmen:716}. She {does:18564} have intermenstrual bleeding.  Sex activity: {sex active: 315163}.  Last Pap: N/A due to age Hx of STDs: {STD hx:14358}  There is no FH of breast cancer. There is no FH of ovarian cancer. The patient {does:18564} do self-breast exams.  Tobacco use: {tob:20664} Alcohol use: {Alcohol:11675} No drug use.  Exercise: {exercise:31265}  She {does:18564} get adequate calcium and Vitamin D in her diet.  Patient Active Problem List   Diagnosis Date Noted   Preventative health care 06/01/2022   Malnutrition ([place]) 01/13/2021   Chronic nausea 01/13/2021   GAD (generalized anxiety disorder) 12/01/2019   Major depressive disorder 04/10/2016   Frequent headaches 04/10/2016    No past surgical history on file.  Family History  Problem Relation Age of Onset   Diabetes Maternal Grandmother    Hyperlipidemia Maternal Grandmother    Hyperlipidemia Maternal Grandfather     Social History   Socioeconomic History   Marital status: Single    Spouse name: Not on file   Number of children: Not on file   Years of education: Not on file   Highest education level: Not on file  Occupational History   Not on file  Tobacco Use   Smoking status: Never   Smokeless tobacco: Never  Vaping Use   Vaping Use: Every day  Substance and Sexual Activity   Alcohol use: No    Alcohol/week: 0.0 standard drinks of alcohol   Drug use: Yes    Types: Marijuana   Sexual activity: Not on file  Other Topics Concern   Not on file  Social History Narrative   Student at [employer].   Rising 10th grader.   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   Wants to be Social research officer, government.   She enjoys  gymnastics, spending time with friend.    Social Determinants of Health   Financial Resource Strain: Not on file  Food Insecurity: Not on file  Transportation Needs: Not on file  Physical Activity: Not on file  Stress: Not on file  Social Connections: Not on file  Intimate Partner Violence: Not on file     Current Outpatient Medications:    sertraline (ZOLOFT) 25 MG tablet, Take 1 tablet (25 mg total) by mouth daily. For anxiety., Disp: 90 tablet, Rfl: 3     ROS:  Review of Systems BREAST: No symptoms   Objective: There were no vitals taken for this visit.   OBGyn Exam  Results: No results found for this or any previous visit (from the past 24 hour(s)).  Assessment/Plan: No diagnosis found.  No orders of the defined types were placed in this encounter.            GYN counsel {counseling: 16159}     F/U  No follow-ups on file.  Rainelle Sulewski B. Verdella Laidlaw, PA-C 08/01/2022 8:52 PM

## 2022-08-02 ENCOUNTER — Ambulatory Visit (INDEPENDENT_AMBULATORY_CARE_PROVIDER_SITE_OTHER): Payer: BC Managed Care – PPO | Admitting: Obstetrics and Gynecology

## 2022-08-02 ENCOUNTER — Encounter: Payer: Self-pay | Admitting: Obstetrics and Gynecology

## 2022-08-02 ENCOUNTER — Other Ambulatory Visit (HOSPITAL_COMMUNITY)
Admission: RE | Admit: 2022-08-02 | Discharge: 2022-08-02 | Disposition: A | Discharge status: Home / Self Care | Payer: BC Managed Care – PPO | Source: Ambulatory Visit | Attending: Obstetrics and Gynecology | Admitting: Obstetrics and Gynecology

## 2022-08-02 VITALS — BP 110/70 | Ht 63.0 in | Wt 88.0 lb

## 2022-08-02 DIAGNOSIS — Z124 Encounter for screening for malignant neoplasm of cervix: Secondary | ICD-10-CM | POA: Insufficient documentation

## 2022-08-02 DIAGNOSIS — Z113 Encounter for screening for infections with a predominantly sexual mode of transmission: Secondary | ICD-10-CM | POA: Diagnosis not present

## 2022-08-02 DIAGNOSIS — Z01411 Encounter for gynecological examination (general) (routine) with abnormal findings: Secondary | ICD-10-CM | POA: Diagnosis not present

## 2022-08-02 DIAGNOSIS — Z1329 Encounter for screening for other suspected endocrine disorder: Secondary | ICD-10-CM | POA: Diagnosis not present

## 2022-08-02 DIAGNOSIS — Z23 Encounter for immunization: Secondary | ICD-10-CM | POA: Diagnosis not present

## 2022-08-02 DIAGNOSIS — N643 Galactorrhea not associated with childbirth: Secondary | ICD-10-CM | POA: Diagnosis not present

## 2022-08-02 DIAGNOSIS — Z01419 Encounter for gynecological examination (general) (routine) without abnormal findings: Secondary | ICD-10-CM

## 2022-08-02 NOTE — Patient Instructions (Signed)
I value your feedback and you entrusting us with your care. If you get a [facility] patient survey, I would appreciate you taking the time to let us know about your experience today. Thank you! ? ? ?

## 2022-08-03 LAB — T4, FREE: Free T4: 1.08 ng/dL (ref 0.82–1.77)

## 2022-08-03 LAB — TSH: TSH: 1.04 u[IU]/mL (ref 0.450–4.500)

## 2022-08-03 LAB — PROLACTIN: Prolactin: 25.1 ng/mL — ABNORMAL HIGH (ref 4.8–23.3)

## 2022-08-06 ENCOUNTER — Telehealth: Payer: Self-pay | Admitting: Obstetrics and Gynecology

## 2022-08-06 DIAGNOSIS — A749 Chlamydial infection, unspecified: Secondary | ICD-10-CM

## 2022-08-06 LAB — CYTOLOGY - PAP
Adequacy: ABSENT
Chlamydia: POSITIVE — AB
Comment: NEGATIVE
Comment: NORMAL
Neisseria Gonorrhea: NEGATIVE

## 2022-08-06 MED ORDER — AZITHROMYCIN 500 MG PO TABS: 1000.0000 mg | ORAL_TABLET | Freq: Once | ORAL | 0 refills | Status: AC

## 2022-08-06 NOTE — Telephone Encounter (Signed)
Pt aware of pos chlamydia. Rx azithro eRxd. CMA to f/u with pt re: partner tx. No sex for 1 wk after both completed tx. RTO in 4 wks for TOC. CMA to notify [facility]. Pt aware of LGSIL on pap, repeat in 12 months

## 2022-08-06 NOTE — Progress Notes (Signed)
Pls f/u with pt about partner tx. She is aware.

## 2022-08-07 ENCOUNTER — Encounter: Payer: Self-pay | Admitting: Obstetrics and Gynecology

## 2022-08-07 ENCOUNTER — Other Ambulatory Visit: Payer: Self-pay

## 2022-08-07 DIAGNOSIS — A749 Chlamydial infection, unspecified: Secondary | ICD-10-CM

## 2022-08-07 MED ORDER — DOXYCYCLINE HYCLATE 100 MG PO CAPS: 100.0000 mg | ORAL_CAPSULE | Freq: Two times a day (BID) | ORAL | 0 refills | Status: DC

## 2022-08-07 MED ORDER — AZITHROMYCIN 500 MG PO TABS: ORAL_TABLET | ORAL | 0 refills | Status: DC

## 2022-09-27 ENCOUNTER — Ambulatory Visit: Payer: BC Managed Care – PPO

## 2022-09-27 ENCOUNTER — Ambulatory Visit (INDEPENDENT_AMBULATORY_CARE_PROVIDER_SITE_OTHER): Payer: BC Managed Care – PPO

## 2022-09-27 VITALS — BP 113/71 | HR 101 | Resp 16 | Ht 63.0 in | Wt 88.1 lb

## 2022-09-27 DIAGNOSIS — Z23 Encounter for immunization: Secondary | ICD-10-CM

## 2022-09-27 NOTE — Progress Notes (Cosign Needed Addendum)
    NURSE VISIT NOTE  Subjective:    Patient ID: Brandy Bowen, female    DOB: 2001-06-21, 21 y.o.   MRN: [id number]  HPI  Patient is a 21 y.o. G0P0000 female who presents as a nurse visit for her 2nd Gardasil Vaccine. She had her first vaccine on 08/02/2022. She had no side effects or reactions to her first vaccine.  The following portions of the patient's history were reviewed and updated as appropriate: allergies, current medications, past family history, past medical history, past social history, past surgical history, and problem list.  Review of Systems Pertinent items are noted in HPI.   Objective:   Blood pressure 113/71, pulse (!) 101, resp. rate 16, height 5\' 3"  (1.6 m), weight 88 lb 1.6 oz (40 kg). Body mass index is 15.61 kg/m.    Assessment:   1. Need for HPV vaccine      Plan:   1. Need for HPV vaccine  - HPV 9-valent vaccine,Recombinat  Return in 4 months for third Gardasil vaccine.    , CMA [facility]

## 2022-09-27 NOTE — Patient Instructions (Signed)
HPV (Human Papillomavirus) Vaccine: What You Need to Know 1. Why get vaccinated? HPV (human papillomavirus) vaccine can prevent infection with some types of human papillomavirus. HPV infections can cause certain types of cancers, including: cervical, vaginal, and vulvar cancers in women penile cancer in men anal cancers in both men and women cancers of tonsils, base of tongue, and back of throat (oropharyngeal cancer) in both men and women HPV infections can also cause anogenital warts. HPV vaccine can prevent over 90% of cancers caused by HPV. HPV is spread through intimate skin-to-skin or sexual contact. HPV infections are so common that nearly all people will get at least one type of HPV at some time in their lives. Most HPV infections go away on their own within 2 years. But sometimes HPV infections will last longer and can cause cancers later in life. 2. HPV vaccine HPV vaccine is routinely recommended for adolescents at 6 or 21 years of age to ensure they are protected before they are exposed to the virus. HPV vaccine may be given beginning at age 41 years and vaccination is recommended for everyone through 21 years of age. HPV vaccine may be given to adults 48 through 21 years of age, based on discussions between the patient and health care provider. Most children who get the first dose before 45 years of age need 2 doses of HPV vaccine. People who get the first dose at or after 7 years of age and younger people with certain immunocompromising conditions need 3 doses. Your health care provider can give you more information. HPV vaccine may be given at the same time as other vaccines. 3. Talk with your health care provider Tell your vaccination provider if the person getting the vaccine: Has had an allergic reaction after a previous dose of HPV vaccine, or has any severe, life-threatening allergies Is pregnant--HPV vaccine is not recommended until after pregnancy In some cases, your health  care provider may decide to postpone HPV vaccination until a future visit. People with minor illnesses, such as a cold, may be vaccinated. People who are moderately or severely ill should usually wait until they recover before getting HPV vaccine. Your health care provider can give you more information. 4. Risks of a vaccine reaction Soreness, redness, or swelling where the shot is given can happen after HPV vaccination. Fever or headache can happen after HPV vaccination. People sometimes faint after medical procedures, including vaccination. Tell your provider if you feel dizzy or have vision changes or ringing in the ears. As with any medicine, there is a very remote chance of a vaccine causing a severe allergic reaction, other serious injury, or death. 5. What if there is a serious problem? An allergic reaction could occur after the vaccinated person leaves the clinic. If you see signs of a severe allergic reaction (hives, swelling of the face and throat, difficulty breathing, a fast heartbeat, dizziness, or weakness), call 9-1-1 and get the person to the nearest hospital. For other signs that concern you, call your health care provider. Adverse reactions should be reported to the Vaccine Adverse Event Reporting System (VAERS). Your health care provider will usually file this report, or you can do it yourself. Visit the VAERS website at www.vaers.SamedayNews.es or call 503-828-0934. VAERS is only for reporting reactions, and VAERS staff members do not give medical advice. 6. The National Vaccine Injury Compensation Program The National Vaccine Injury Compensation Program (VICP) is a federal program that was created to compensate people who may have been injured  by certain vaccines. Claims regarding alleged injury or death due to vaccination have a time limit for filing, which may be as short as two years. Visit the VICP website at GoldCloset.com.ee or call (507) 698-4803 to learn about the  program and about filing a claim. 7. How can I learn more? Ask your health care provider. Call your local or state health department. Visit the website of the Food and Drug Administration (FDA) for vaccine package inserts and additional information at TraderRating.uy. Contact the Centers for Disease Control and Prevention (CDC): Call 573 116 2445 (1-800-CDC-INFO) or Visit CDC's website at http://hunter.com/. Source: CDC Vaccine Information Statement HPV Vaccine (06/17/2020) This same material is available at http://www.wolf.info/ for no charge. This information is not intended to replace advice given to you by your health care provider. Make sure you discuss any questions you have with your health care provider. Document Revised: 09/25/2021 Document Reviewed: 07/20/2021 Elsevier Patient Education  [place]. Human Papillomavirus (HPV) Vaccine Injection What is this medication? HUMAN PAPILLOMAVIRUS VACCINE (HYOO muhn pap uh LOH muh vahy ruhs vak SEEN) reduces the risk of human papillomavirus (HPV). It does not treat HPV. It is still possible to get HPV after receiving this vaccine, but the symptoms may be less severe or not last as long. It works by helping your immune system learn how to fight off a future infection. This medicine may be used for other purposes; ask your health care provider or pharmacist if you have questions. COMMON BRAND NAME(S): Gardasil 9 What should I tell my care team before I take this medication? They need to know if you have any of these conditions: Fever Hemophilia HIV or AIDS Immune system problems Infection Low platelets An unusual reaction to human papillomavirus vaccine, yeast, other vaccines, other medications, foods, dyes, or preservatives Pregnant or trying to get pregnant Breastfeeding How should I use this medication? This vaccine is injected into a muscle. It is given by your care team. This vaccine requires 2 or 3  doses to get the full benefit. Set a reminder for when your next dose is due. A copy of the Vaccine Information Statement will be given before each vaccination. Be sure to read this information carefully each time. This sheet may change often. Talk to your care team about the use of this medication in children. While it may be prescribed for children as young as 9 years for selected conditions, precautions do apply. Overdosage: If you think you have taken too much of this medicine contact a poison control center or emergency room at once. NOTE: This medicine is only for you. Do not share this medicine with others. What if I miss a dose? Keep appointments for follow-up doses as directed. It is important not to miss your dose. Call your care team if you are unable to keep an appointment. What may interact with this medication? Certain medications for arthritis Medications for organ transplant Medications to treat cancer Steroid medications, such as prednisone or cortisone This list may not describe all possible interactions. Give your health care provider a list of all the medicines, herbs, non-prescription drugs, or dietary supplements you use. Also tell them if you smoke, drink alcohol, or use illegal drugs. Some items may interact with your medicine. What should I watch for while using this medication? Visit your care team regularly. Report any side effects to your care team right away. This vaccine, like all vaccines, may not fully protect everyone. What side effects may I notice from receiving this medication? Side  effects that you should report to your care team as soon as possible: Allergic reactions--skin rash, itching, hives, swelling of the face, lips, tongue, or throat Feeling faint or lightheaded Side effects that usually do not require medical attention (report these to your care team if they continue or are bothersome): Diarrhea Dizziness Fatigue Fever Headache Nausea Pain,  redness, irritation, or bruising at the injection site This list may not describe all possible side effects. Call your doctor for medical advice about side effects. You may report side effects to FDA at 1-800-FDA-1088. Where should I keep my medication? This vaccine is only given by your care team. It will not be stored at home. NOTE: This sheet is a summary. It may not cover all possible information. If you have questions about this medicine, talk to your doctor, pharmacist, or health care provider.  2023 Elsevier/Gold Standard (2022-04-10 00:00:00)

## 2022-10-18 ENCOUNTER — Ambulatory Visit: Payer: BC Managed Care – PPO

## 2022-10-18 ENCOUNTER — Other Ambulatory Visit (HOSPITAL_COMMUNITY)
Admission: RE | Admit: 2022-10-18 | Discharge: 2022-10-18 | Disposition: A | Discharge status: Home / Self Care | Payer: BC Managed Care – PPO | Source: Ambulatory Visit | Attending: Obstetrics and Gynecology | Admitting: Obstetrics and Gynecology

## 2022-10-18 VITALS — BP 109/74 | HR 103 | Ht 63.0 in | Wt 84.0 lb

## 2022-10-18 DIAGNOSIS — Z202 Contact with and (suspected) exposure to infections with a predominantly sexual mode of transmission: Secondary | ICD-10-CM | POA: Insufficient documentation

## 2022-10-18 NOTE — Progress Notes (Signed)
   Established Patient Office Visit  Subjective   Patient ID: Brandy Bowen, female    DOB: 2000-12-16  Age: 21 y.o. MRN: [id number]  Patient presents today for STD testing via self swab culture. She states possible STD exposure. No Symptoms . Self swab culture preformed. All questions asked, patient aware office will be in touch with results HIV ordered   HPI    ROS    Objective:     There were no vitals taken for this visit.   Physical Exam   No results found for any visits on 10/18/22.    The ASCVD Risk score (Arnett DK, et al., 2019) failed to calculate for the following reasons:   The 2019 ASCVD risk score is only valid for ages 39 to 80    Assessment & Plan:   Problem List Items Addressed This Visit   None   No follow-ups on file.    Loney Laurence, CMA

## 2022-10-19 LAB — CERVICOVAGINAL ANCILLARY ONLY
Bacterial Vaginitis (gardnerella): NEGATIVE
Candida Glabrata: NEGATIVE
Candida Vaginitis: POSITIVE — AB
Chlamydia: NEGATIVE
Comment: NEGATIVE
Comment: NEGATIVE
Comment: NEGATIVE
Comment: NEGATIVE
Comment: NEGATIVE
Comment: NORMAL
Neisseria Gonorrhea: NEGATIVE
Trichomonas: NEGATIVE

## 2022-10-19 LAB — HIV ANTIBODY (ROUTINE TESTING W REFLEX): HIV Screen 4th Generation wRfx: NONREACTIVE

## 2022-10-21 MED ORDER — FLUCONAZOLE 150 MG PO TABS: 150.0000 mg | ORAL_TABLET | Freq: Once | ORAL | 0 refills | Status: AC

## 2022-10-21 NOTE — Addendum Note (Signed)
Addended by: Althea Grimmer B on: 10/21/2022 08:38 AM   Modules accepted: Orders

## 2023-01-25 ENCOUNTER — Ambulatory Visit (INDEPENDENT_AMBULATORY_CARE_PROVIDER_SITE_OTHER): Payer: BC Managed Care – PPO

## 2023-01-25 VITALS — BP 104/63 | HR 89 | Ht 63.0 in | Wt 89.0 lb

## 2023-01-25 DIAGNOSIS — Z23 Encounter for immunization: Secondary | ICD-10-CM

## 2023-01-25 NOTE — Progress Notes (Signed)
    NURSE VISIT NOTE  Subjective:    Patient ID: Brandy Bowen, female    DOB: 09/18/2001, 22 y.o.   MRN: [id number]  HPI  Patient is a 22 y.o. G0P0000 female who presents for her third Gardasil injection.She had her first vaccine on 08/02/2022. She had no side effects or reactions to her first vaccine her Second  on 09/27/2022  Order to administer given by  Elmo Putt Copland PA. Objective:    Ht 5\' 3"  (1.6 m)   Wt 89 lb (40.4 kg)   BMI 15.77 kg/m   Given by: Levert Feinstein, CMA Site:  right deltoid   Assessment:   1. Need for HPV vaccine      Plan:   Patient received the last vax.   Landis Gandy, [place]

## 2023-06-27 ENCOUNTER — Ambulatory Visit: Payer: BC Managed Care – PPO | Admitting: Primary Care

## 2023-06-27 VITALS — BP 120/68 | HR 105 | Temp 97.8°F | Ht 63.0 in | Wt 88.0 lb

## 2023-06-27 DIAGNOSIS — R519 Headache, unspecified: Secondary | ICD-10-CM | POA: Diagnosis not present

## 2023-06-27 DIAGNOSIS — Z0000 Encounter for general adult medical examination without abnormal findings: Secondary | ICD-10-CM | POA: Diagnosis not present

## 2023-06-27 DIAGNOSIS — E559 Vitamin D deficiency, unspecified: Secondary | ICD-10-CM | POA: Diagnosis not present

## 2023-06-27 DIAGNOSIS — F411 Generalized anxiety disorder: Secondary | ICD-10-CM | POA: Diagnosis not present

## 2023-06-27 DIAGNOSIS — F3342 Major depressive disorder, recurrent, in full remission: Secondary | ICD-10-CM | POA: Diagnosis not present

## 2023-06-27 DIAGNOSIS — D72819 Decreased white blood cell count, unspecified: Secondary | ICD-10-CM

## 2023-06-27 LAB — VITAMIN D 25 HYDROXY (VIT D DEFICIENCY, FRACTURES): VITD: 26.67 ng/mL — ABNORMAL LOW (ref 30.00–100.00)

## 2023-06-27 NOTE — Patient Instructions (Signed)
Stop by the lab prior to leaving today. I will notify you of your results once received.   It was a pleasure to see you today!  

## 2023-06-27 NOTE — Assessment & Plan Note (Signed)
Improved!  No concerns today.   Continue to monitor.  

## 2023-06-27 NOTE — Assessment & Plan Note (Signed)
Chronic.  Repeat CBC with differential pending. Add pathology smear.
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
## 2023-06-27 NOTE — Assessment & Plan Note (Signed)
Continue vitamin D supplements, she does not recall the dose.  Repeat vitamin D level pending.

## 2023-06-27 NOTE — Assessment & Plan Note (Signed)
Improved and doing well off Zoloft.  Remain off Zoloft 25 mg for now. She will update if anything changes and symptoms return.

## 2023-06-27 NOTE — Addendum Note (Signed)
Addended by: Lovena Neighbours on: 06/27/2023 09:24 AM   Modules accepted: Orders

## 2023-06-27 NOTE — Assessment & Plan Note (Signed)
Immunizations UTD. Pap smear UTD  Discussed the importance of a healthy diet and regular exercise in order for weight loss, and to reduce the risk of further co-morbidity.  Exam stable. Labs pending.  Follow up in 1 year for repeat physical.  

## 2023-06-28 LAB — CBC WITH DIFFERENTIAL/PLATELET
Absolute Monocytes: 400 {cells}/uL (ref 200–950)
Basophils Absolute: 39 {cells}/uL (ref 0–200)
Basophils Relative: 0.9 %
Eosinophils Absolute: 30 {cells}/uL (ref 15–500)
Eosinophils Relative: 0.7 %
HCT: 36.5 % (ref 35.0–45.0)
Hemoglobin: 11.8 g/dL (ref 11.7–15.5)
Lymphs Abs: 1871 {cells}/uL (ref 850–3900)
MCH: 29.9 pg (ref 27.0–33.0)
MCHC: 32.3 g/dL (ref 32.0–36.0)
MCV: 92.4 fL (ref 80.0–100.0)
MPV: 12.3 fL (ref 7.5–12.5)
Monocytes Relative: 9.3 %
Neutro Abs: 1961 {cells}/uL (ref 1500–7800)
Neutrophils Relative %: 45.6 %
Platelets: 198 10*3/uL (ref 140–400)
RBC: 3.95 10*6/uL (ref 3.80–5.10)
RDW: 13.1 % (ref 11.0–15.0)
Total Lymphocyte: 43.5 %
WBC: 4.3 10*3/uL (ref 3.8–10.8)

## 2023-06-28 LAB — PATHOLOGIST SMEAR REVIEW
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
## 2023-09-12 ENCOUNTER — Ambulatory Visit (INDEPENDENT_AMBULATORY_CARE_PROVIDER_SITE_OTHER): Payer: BC Managed Care – PPO | Admitting: Obstetrics and Gynecology

## 2023-09-12 ENCOUNTER — Encounter: Payer: Self-pay | Admitting: Obstetrics and Gynecology

## 2023-09-12 ENCOUNTER — Other Ambulatory Visit (HOSPITAL_COMMUNITY)
Admission: RE | Admit: 2023-09-12 | Discharge: 2023-09-12 | Disposition: A | Discharge status: Home / Self Care | Payer: BC Managed Care – PPO | Source: Ambulatory Visit | Attending: Obstetrics and Gynecology | Admitting: Obstetrics and Gynecology

## 2023-09-12 VITALS — BP 90/64 | Ht 63.0 in | Wt 87.0 lb

## 2023-09-12 DIAGNOSIS — R87612 Low grade squamous intraepithelial lesion on cytologic smear of cervix (LGSIL): Secondary | ICD-10-CM | POA: Insufficient documentation

## 2023-09-12 DIAGNOSIS — Z01419 Encounter for gynecological examination (general) (routine) without abnormal findings: Secondary | ICD-10-CM | POA: Diagnosis not present

## 2023-09-12 DIAGNOSIS — Z124 Encounter for screening for malignant neoplasm of cervix: Secondary | ICD-10-CM | POA: Diagnosis present

## 2023-09-12 DIAGNOSIS — Z113 Encounter for screening for infections with a predominantly sexual mode of transmission: Secondary | ICD-10-CM | POA: Diagnosis present

## 2023-09-12 NOTE — Patient Instructions (Signed)
I value your feedback and you entrusting us with your care. If you get a [facility] patient survey, I would appreciate you taking the time to let us know about your experience today. Thank you! ? ? ?

## 2023-09-19 LAB — CYTOLOGY - PAP
Chlamydia: NEGATIVE
Comment: NEGATIVE
Comment: NORMAL
Diagnosis: NEGATIVE
Neisseria Gonorrhea: NEGATIVE
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
## 2024-07-01 ENCOUNTER — Telehealth: Payer: Self-pay | Admitting: Primary Care
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
## 2024-07-02 ENCOUNTER — Ambulatory Visit (INDEPENDENT_AMBULATORY_CARE_PROVIDER_SITE_OTHER): Admitting: Family Medicine

## 2024-07-02 ENCOUNTER — Ambulatory Visit (INDEPENDENT_AMBULATORY_CARE_PROVIDER_SITE_OTHER)
Admission: RE | Admit: 2024-07-02 | Discharge: 2024-07-02 | Disposition: A | Discharge status: Home / Self Care | Source: Ambulatory Visit | Attending: Family Medicine | Admitting: Family Medicine

## 2024-07-02 ENCOUNTER — Encounter: Admitting: Primary Care

## 2024-07-02 VITALS — BP 90/60 | HR 94 | Temp 98.3°F | Ht 63.0 in | Wt 89.0 lb

## 2024-07-02 DIAGNOSIS — G8929 Other chronic pain: Secondary | ICD-10-CM

## 2024-07-02 DIAGNOSIS — M545 Low back pain, unspecified: Secondary | ICD-10-CM

## 2024-07-02 DIAGNOSIS — H61893 Other specified disorders of external ear, bilateral: Secondary | ICD-10-CM | POA: Diagnosis not present

## 2024-07-02 MED ORDER — PREDNISONE 20 MG PO TABS: ORAL_TABLET | ORAL | 0 refills | Status: DC

## 2024-07-02 MED ORDER — HYDROCORTISONE 2.5 % EX OINT: TOPICAL_OINTMENT | Freq: Two times a day (BID) | CUTANEOUS | 0 refills | Status: AC
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
## 2024-07-14 ENCOUNTER — Ambulatory Visit: Payer: Self-pay | Admitting: Family Medicine

## 2024-07-23 ENCOUNTER — Ambulatory Visit (INDEPENDENT_AMBULATORY_CARE_PROVIDER_SITE_OTHER): Admitting: Primary Care

## 2024-07-23 ENCOUNTER — Encounter: Payer: Self-pay | Admitting: Primary Care

## 2024-07-23 VITALS — BP 102/64 | HR 111 | Temp 98.0°F | Ht 63.0 in | Wt 88.0 lb

## 2024-07-23 DIAGNOSIS — Z1159 Encounter for screening for other viral diseases: Secondary | ICD-10-CM

## 2024-07-23 DIAGNOSIS — Z0000 Encounter for general adult medical examination without abnormal findings: Secondary | ICD-10-CM

## 2024-07-23 DIAGNOSIS — F411 Generalized anxiety disorder: Secondary | ICD-10-CM

## 2024-07-23 DIAGNOSIS — R519 Headache, unspecified: Secondary | ICD-10-CM

## 2024-07-23 DIAGNOSIS — E46 Unspecified protein-calorie malnutrition: Secondary | ICD-10-CM

## 2024-07-23 DIAGNOSIS — F3342 Major depressive disorder, recurrent, in full remission: Secondary | ICD-10-CM

## 2024-07-23 NOTE — Patient Instructions (Signed)
 Stop by the lab prior to leaving today. I will notify you of your results once received.   It was a pleasure to see you today!

## 2024-07-23 NOTE — Assessment & Plan Note (Signed)
Immunizations UTD. Declines influenza vaccine. Pap smear UTD  Discussed the importance of a healthy diet and regular exercise in order for weight loss, and to reduce the risk of further co-morbidity.  Exam stable. Labs pending.  Follow up in 1 year for repeat physical.  

## 2024-07-23 NOTE — Assessment & Plan Note (Signed)
 Controlled.  No concerns today. Continue to monitor.

## 2024-07-23 NOTE — Assessment & Plan Note (Signed)
 Weight is stable although she is still underweight. Offered nutritionist referral for which she declines.  Continue to monitor.

## 2024-07-23 NOTE — Assessment & Plan Note (Signed)
Controlled, no concerns today. Continue to monitor.

## 2024-07-23 NOTE — Progress Notes (Signed)
 Subjective:    Patient ID: Brandy Bowen, female    DOB: 18-Feb-2001, 23 y.o.   MRN: [id number]  Brandy Bowen is a very pleasant 23 y.o. female who presents today for complete physical and follow up of chronic conditions.  Immunizations: -Tetanus: Completed in 2023 -Influenza: Declines influenza vaccine.  -HPV: completed series  Diet: Fair diet.  Exercise: No regular exercise.  Eye exam: Completes annually  Dental exam: Completes semi-annually    Pap Smear: Completed in 2024  BP Readings from Last 3 Encounters:  07/23/24 102/64  07/02/24 90/60  09/12/23 90/64   Wt Readings from Last 3 Encounters:  07/23/24 88 lb (39.9 kg)  07/02/24 89 lb (40.4 kg)  09/12/23 87 lb (39.5 kg)       Review of Systems  Constitutional:  Negative for unexpected weight change.  HENT:  Negative for rhinorrhea.   Respiratory:  Negative for cough and shortness of breath.   Cardiovascular:  Negative for chest pain.  Gastrointestinal:  Negative for constipation and diarrhea.  Genitourinary:  Negative for difficulty urinating and menstrual problem.  Musculoskeletal:  Positive for arthralgias and back pain.  Skin:  Negative for rash.  Allergic/Immunologic: Negative for environmental allergies.  Neurological:  Negative for dizziness, numbness and headaches.  Psychiatric/Behavioral:  The patient is not nervous/anxious.          Past Medical History:  Diagnosis Date   Anxiety    Depression     Social History   Socioeconomic History   Marital status: Single    Spouse name: Not on file   Number of children: Not on file   Years of education: Not on file   Highest education level: Associate degree: academic program  Occupational History   Not on file  Tobacco Use   Smoking status: Never   Smokeless tobacco: Never  Vaping Use   Vaping status: Every Day  Substance and Sexual Activity   Alcohol use: Yes    Comment: socially   Drug use: Yes    Types: Marijuana   Sexual activity:  Yes    Birth control/protection: Condom, None  Other Topics Concern   Not on file  Social History Narrative   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   Wants to be Forensic scientist.   She enjoys gymnastics, spending time with friend.    Social Drivers of Corporate investment banker Strain: Low Risk  (07/02/2024)   Overall Financial Resource Strain (CARDIA)    Difficulty of Paying Living Expenses: Not very hard  Food Insecurity: No Food Insecurity (07/02/2024)   Hunger Vital Sign    Worried About Running Out of Food in the Last Year: Never true    Ran Out of Food in the Last Year: Never true  Transportation Needs: No Transportation Needs (07/02/2024)   PRAPARE - Administrator, Civil Service (Medical): No    Lack of Transportation (Non-Medical): No  Physical Activity: Insufficiently Active (07/02/2024)   Exercise Vital Sign    Days of Exercise per Week: 3 days    Minutes of Exercise per Session: 30 min  Stress: No Stress Concern Present (07/02/2024)   [employer] of Occupational Health - Occupational Stress Questionnaire    Feeling of Stress: Only a little  Social Connections: Socially Isolated (07/02/2024)   Social Connection and Isolation Panel    Frequency of Communication with Friends and Family: Twice a week    Frequency of  Social Gatherings with Friends and Family: Once a week    Attends Religious Services: Never    Database administrator or Organizations: No    Attends Engineer, structural: Not on file    Marital Status: Never married  Catering manager Violence: Not on file    Past Surgical History:  Procedure Laterality Date   NO PAST SURGERIES      Family History  Problem Relation Age of Onset   Diabetes Maternal Grandmother    Hyperlipidemia Maternal Grandmother    Hyperlipidemia Maternal Grandfather     No Known Allergies  Current Outpatient Medications on File Prior to Visit  Medication  Sig Dispense Refill   hydrocortisone  2.5 % ointment Apply topically 2 (two) times daily. 30 g 0   No current facility-administered medications on file prior to visit.    BP 102/64   Pulse (!) 111   Temp 98 F (36.7 C) (Temporal)   Ht 5' 3 (1.6 m)   Wt 88 lb (39.9 kg)   LMP 07/14/2024 (Exact Date)   SpO2 100%   BMI 15.59 kg/m  Objective:   Physical Exam Constitutional:      Appearance: She is underweight.  HENT:     Right Ear: Tympanic membrane and ear canal normal.     Left Ear: Tympanic membrane and ear canal normal.  Eyes:     Pupils: Pupils are equal, round, and reactive to light.  Cardiovascular:     Rate and Rhythm: Normal rate and regular rhythm.  Pulmonary:     Effort: Pulmonary effort is normal.     Breath sounds: Normal breath sounds.  Abdominal:     General: Bowel sounds are normal.     Palpations: Abdomen is soft.     Tenderness: There is no abdominal tenderness.  Musculoskeletal:        General: Normal range of motion.     Cervical back: Neck supple.  Skin:    General: Skin is warm and dry.  Neurological:     Mental Status: She is alert and oriented to person, place, and time.     Cranial Nerves: No cranial nerve deficit.     Deep Tendon Reflexes:     Reflex Scores:      Patellar reflexes are 2+ on the right side and 2+ on the left side. Psychiatric:        Mood and Affect: Mood normal.     Physical Exam        Assessment & Plan:  Preventative health care Assessment & Plan: Immunizations UTD. Declines influenza vaccine.  Pap smear UTD.  Discussed the importance of a healthy diet and regular exercise in order for weight loss, and to reduce the risk of further co-morbidity.  Exam stable. Labs pending.  Follow up in 1 year for repeat physical.   Orders: -     Comprehensive metabolic panel with GFR  Frequent headaches Assessment & Plan: Controlled.  Continue to monitor.     Encounter for hepatitis C screening test for low risk  patient -     Hepatitis C antibody  GAD (generalized anxiety disorder) Assessment & Plan: Controlled.  No concerns today. Continue to monitor.    Recurrent major depressive disorder, in full remission [facility]) Assessment & Plan: Controlled, no concerns today.  Continue to monitor.   Malnutrition, unspecified type [facility]) Assessment & Plan: Weight is stable although she is still underweight. Offered nutritionist referral for which she declines.  Continue to monitor.  Assessment and Plan Assessment & Plan         Comer MARLA Gaskins, NP   History of Present Illness

## 2024-07-23 NOTE — Assessment & Plan Note (Signed)
 Controlled.  Continue to monitor.

## 2024-07-24 ENCOUNTER — Ambulatory Visit: Payer: Self-pay | Admitting: Primary Care

## 2024-07-24 LAB — COMPREHENSIVE METABOLIC PANEL WITH GFR
ALT: 10 U/L (ref 0–35)
AST: 17 U/L (ref 0–37)
Albumin: 4.7 g/dL (ref 3.5–5.2)
Alkaline Phosphatase: 40 U/L (ref 39–117)
BUN: 15 mg/dL (ref 6–23)
CO2: 28 meq/L (ref 19–32)
Calcium: 10.1 mg/dL (ref 8.4–10.5)
Chloride: 102 meq/L (ref 96–112)
Creatinine, Ser: 0.95 mg/dL (ref 0.40–1.20)
GFR: 84.61 mL/min (ref >60.00)
Glucose, Bld: 76 mg/dL (ref 70–99)
Potassium: 3.7 meq/L (ref 3.5–5.1)
Sodium: 138 meq/L (ref 135–145)
Total Bilirubin: 0.9 mg/dL (ref 0.2–1.2)
Total Protein: 7.9 g/dL (ref 6.0–8.3)

## 2024-07-24 LAB — HEPATITIS C ANTIBODY: Hepatitis C Ab: NONREACTIVE

## 2024-09-11 ENCOUNTER — Ambulatory Visit: Admitting: Obstetrics and Gynecology

## 2025-01-14 ENCOUNTER — Ambulatory Visit: Admitting: Licensed Practical Nurse
